# Patient Record
Sex: Male | Born: 2000 | Hispanic: Yes | Marital: Single | Smoking: Never smoker
Health system: Southern US, Community
[De-identification: ages and names within clinical notes are randomized; demographics above are authoritative.]

## PROBLEM LIST (undated history)

## (undated) DIAGNOSIS — J45909 Unspecified asthma, uncomplicated: Secondary | ICD-10-CM

## (undated) DIAGNOSIS — L309 Dermatitis, unspecified: Secondary | ICD-10-CM

## (undated) DIAGNOSIS — K37 Unspecified appendicitis: Secondary | ICD-10-CM

## (undated) HISTORY — PX: CIRCUMCISION: SUR203

## (undated) HISTORY — PX: APPENDECTOMY: SHX54

---
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
## 2020-12-10 ENCOUNTER — Other Ambulatory Visit: Payer: Self-pay

## 2020-12-10 ENCOUNTER — Encounter (HOSPITAL_COMMUNITY): Payer: Self-pay | Admitting: Oncology

## 2020-12-10 ENCOUNTER — Emergency Department (HOSPITAL_COMMUNITY)
Admission: EM | Admit: 2020-12-10 | Discharge: 2020-12-10 | Disposition: A | Discharge status: Home / Self Care | Payer: Medicaid Other | Attending: Emergency Medicine | Admitting: Emergency Medicine

## 2020-12-10 DIAGNOSIS — L509 Urticaria, unspecified: Secondary | ICD-10-CM | POA: Insufficient documentation

## 2020-12-10 DIAGNOSIS — T7840XA Allergy, unspecified, initial encounter: Secondary | ICD-10-CM

## 2020-12-10 DIAGNOSIS — J45909 Unspecified asthma, uncomplicated: Secondary | ICD-10-CM | POA: Insufficient documentation

## 2020-12-10 DIAGNOSIS — R739 Hyperglycemia, unspecified: Secondary | ICD-10-CM

## 2020-12-10 DIAGNOSIS — R21 Rash and other nonspecific skin eruption: Secondary | ICD-10-CM | POA: Diagnosis present

## 2020-12-10 LAB — CBC
HCT: 49.4 % (ref 39.0–52.0)
Hemoglobin: 17.1 g/dL — ABNORMAL HIGH (ref 13.0–17.0)
MCH: 31.3 pg (ref 26.0–34.0)
MCHC: 34.6 g/dL (ref 30.0–36.0)
MCV: 90.3 fL (ref 80.0–100.0)
Platelets: 385 10*3/uL (ref 150–400)
RBC: 5.47 MIL/uL (ref 4.22–5.81)
RDW: 13 % (ref 11.5–15.5)
WBC: 14.6 10*3/uL — ABNORMAL HIGH (ref 4.0–10.5)
nRBC: 0 % (ref 0.0–0.2)

## 2020-12-10 LAB — BASIC METABOLIC PANEL
Anion gap: 13 (ref 5–15)
BUN: 12 mg/dL (ref 6–20)
CO2: 20 mmol/L — ABNORMAL LOW (ref 22–32)
Calcium: 8.6 mg/dL — ABNORMAL LOW (ref 8.9–10.3)
Chloride: 103 mmol/L (ref 98–111)
Creatinine, Ser: 0.77 mg/dL (ref 0.61–1.24)
GFR, Estimated: >60 mL/min (ref >60)
Glucose, Bld: 222 mg/dL — ABNORMAL HIGH (ref 70–99)
Potassium: 3.4 mmol/L — ABNORMAL LOW (ref 3.5–5.1)
Sodium: 136 mmol/L (ref 135–145)

## 2020-12-10 MED ORDER — EPINEPHRINE 0.3 MG/0.3ML IJ SOAJ: 0.3000 mg | INTRAMUSCULAR | 0 refills | Status: AC | PRN

## 2020-12-10 MED ORDER — METHYLPREDNISOLONE 4 MG PO TBPK: ORAL_TABLET | ORAL | 0 refills | Status: AC

## 2020-12-10 MED ORDER — FAMOTIDINE IN NACL 20-0.9 MG/50ML-% IV SOLN
20.0000 mg | Freq: Once | INTRAVENOUS | Status: AC
  Administered 2020-12-10: 20 mg via INTRAVENOUS
  Filled 2020-12-10: qty 50

## 2020-12-10 MED ORDER — SODIUM CHLORIDE 0.9 % IV BOLUS
1000.0000 mL | Freq: Once | INTRAVENOUS | Status: AC
Start: 2020-12-10 — End: 2020-12-10
  Administered 2020-12-10: 1000 mL via INTRAVENOUS

## 2020-12-10 MED ORDER — ACETAMINOPHEN 325 MG PO TABS
650.0000 mg | ORAL_TABLET | Freq: Once | ORAL | Status: AC
  Administered 2020-12-10: 650 mg via ORAL
  Filled 2020-12-10: qty 2

## 2020-12-10 MED ORDER — DIPHENHYDRAMINE HCL 50 MG/ML IJ SOLN
25.0000 mg | Freq: Once | INTRAMUSCULAR | Status: AC
Start: 2020-12-10 — End: 2020-12-10
  Administered 2020-12-10: 25 mg via INTRAVENOUS
  Filled 2020-12-10: qty 1

## 2020-12-10 MED ORDER — ONDANSETRON HCL 4 MG/2ML IJ SOLN
4.0000 mg | Freq: Once | INTRAMUSCULAR | Status: AC
Start: 2020-12-10 — End: 2020-12-10
  Administered 2020-12-10: 4 mg via INTRAVENOUS
  Filled 2020-12-10: qty 2

## 2020-12-10 MED ORDER — DEXAMETHASONE SODIUM PHOSPHATE 10 MG/ML IJ SOLN
10.0000 mg | Freq: Once | INTRAMUSCULAR | Status: AC
Start: 2020-12-10 — End: 2020-12-10
  Administered 2020-12-10: 10 mg via INTRAVENOUS
  Filled 2020-12-10: qty 1

## 2020-12-10 MED ORDER — SODIUM CHLORIDE 0.9 % IV SOLN
INTRAVENOUS | Status: DC
Start: 2020-12-10 — End: 2020-12-10

## 2020-12-10 NOTE — Discharge Instructions (Addendum)
Get help right away if: °You develop symptoms of an allergic reaction. You may notice them soon after you are exposed to a substance. Symptoms may include: °Flushed skin. °Hives. °Swelling of the eyes, lips, face, mouth, tongue, or throat. °Difficulty breathing, speaking, or swallowing. °Wheezing. °Dizziness or light-headedness. °Fainting. °Pain or cramping in the abdomen. °Vomiting. °Diarrhea. °You used epinephrine. You need more medical care even if the medicine seems to be working. This is important because anaphylaxis may happen again within 72 hours (rebound anaphylaxis). You may need more doses of epinephrine. °

## 2020-12-10 NOTE — ED Provider Notes (Signed)
[facility]-EMERGENCY DEPT Provider Note   CSN: [id number] Arrival date & time: 12/10/20  0930     History Chief Complaint  Patient presents with   Allergic Reaction    Alan Sosa is a 20 y.o. male.  The history is provided by the patient. No language interpreter was used.  Allergic Reaction Presenting symptoms: itching, rash and swelling   Presenting symptoms: no difficulty breathing, no difficulty swallowing and no wheezing   Severity:  Moderate Duration:  30 minutes Prior allergic episodes:  No prior episodes Context: animal exposure   Context comment:  Stung by a swarm of bees or wasps Ineffective treatments:  None tried  No known hx of allergic reactions. He has eczema and Red dye makes it worse.     Past Medical History:  Diagnosis Date   Appendicitis    Asthma    Eczema     Patient Active Problem List   Diagnosis Date Noted   Abdominal pain    S/P appendectomy    AP (abdominal pain)    Hypoxia    Hypoxemia    Fever, unspecified    Appendicitis, acute 09/22/2015    Past Surgical History:  Procedure Laterality Date   APPENDECTOMY     CIRCUMCISION     LAPAROSCOPIC APPENDECTOMY N/A 09/22/2015   Procedure: APPENDECTOMY LAPAROSCOPIC;  Surgeon: Leonia Corona, MD;  Location: [facility] OR;  Service: Pediatrics;  Laterality: N/A;       Family History  Problem Relation Age of Onset   Heart disease Father     Social History   Tobacco Use   Smoking status: Never   Smokeless tobacco: Never  Substance Use Topics   Alcohol use: No   Drug use: No    Home Medications Prior to Admission medications   Medication Sig Start Date End Date Taking? Authorizing Provider  albuterol (PROVENTIL HFA;VENTOLIN HFA) 108 (90 BASE) MCG/ACT inhaler Inhale 2 puffs into the lungs every 4 (four) hours as needed for wheezing or shortness of breath. 01/12/15   Horton, Mayer Masker, MD  fluticasone (FLONASE) 50 MCG/ACT nasal spray Place 1 spray into  both nostrils daily.    [provider]  loratadine (CLARITIN) 10 MG tablet TAKE ONE TABLET BY MOUTH ONCE DAILY 01/25/15   [provider]    Allergies    Red dye  Review of Systems   Review of Systems  HENT:  Negative for trouble swallowing.   Respiratory:  Negative for wheezing.   Skin:  Positive for itching and rash.   Physical Exam Updated Vital Signs BP 122/63 (BP Location: Right Arm)   Pulse (!) 124   Temp 98.2 F (36.8 C) (Oral)   Resp (!) 22   Ht 5\' 8"  (1.727 m)   Wt 99.8 kg   BMI 33.45 kg/m   Physical Exam Vitals and nursing note reviewed.  Constitutional:      General: He is not in acute distress.    Appearance: He is well-developed. He is not diaphoretic.  HENT:     Head: Normocephalic and atraumatic.     Mouth/Throat:     Pharynx: No pharyngeal swelling.     Comments: No swelling of the lips, tongue, hypoglossal region or pharynx. Norma phonation Eyes:     General: No scleral icterus.    Extraocular Movements: Extraocular movements intact.     Conjunctiva/sclera: Conjunctivae normal.     Pupils: Pupils are equal, round, and reactive to light.  Neck:  Trachea: Trachea normal.     Comments: No stridor Cardiovascular:     Rate and Rhythm: Normal rate and regular rhythm.     Heart sounds: Normal heart sounds.  Pulmonary:     Effort: Pulmonary effort is normal. No respiratory distress.     Breath sounds: Normal breath sounds.  Abdominal:     Palpations: Abdomen is soft.     Tenderness: There is no abdominal tenderness.  Musculoskeletal:     Cervical back: Normal range of motion and neck supple.  Skin:    General: Skin is warm and dry.     Findings: Rash present. Rash is urticarial.     Comments: Diffuse , generalized urticaria  Neurological:     Mental Status: He is alert.  Psychiatric:        Behavior: Behavior normal.    ED Results / Procedures / Treatments   Labs (all labs ordered are listed, but only abnormal results  are displayed) Labs Reviewed  BASIC METABOLIC PANEL  CBC    EKG None  Radiology No results found.  Procedures Procedures   Medications Ordered in ED Medications  sodium chloride 0.9 % bolus 1,000 mL (has no administration in time range)    And  0.9 %  sodium chloride infusion (has no administration in time range)  diphenhydrAMINE (BENADRYL) injection 25 mg (has no administration in time range)  ondansetron (ZOFRAN) injection 4 mg (has no administration in time range)  dexamethasone (DECADRON) injection 10 mg (has no administration in time range)  famotidine (PEPCID) IVPB 20 mg premix (has no administration in time range)    ED Course  I have reviewed the triage vital signs and the nursing notes.  Pertinent labs & imaging results that were available during my care of the patient were reviewed by me and considered in my medical decision making (see chart for details).  Clinical Course as of 12/10/20 1453  Fri Dec 10, 2020  0945 20 yo male here after multiple bee stings while working outside.  Reports itching and hives on his body and face.  Denies tongue swelling or difficulty breathing.  Denies history of anaphylaxis or allergies to bee stings.  On exam he is well-appearing.  He is tachycardic.  He has no swelling of the tongue or the uvula.  His voice is clear.  He has no stridor.  His respirations are clear.  Do not suspect this is anaphylaxis at this time.  We will give him some IV Benadryl and IV steroids and monitor him in the ED for improvement. [MT]    Clinical Course User Index [MT] Trifan, Kermit Balo, MD   MDM Rules/Calculators/A&P                           Alan Sosa is a 20 y.o. male who presents to ED for allergic reaction to bee sting. No known triggers or exposures. No evidence of oral swelling or airway compromise. Lungs are clear bilaterally and vitals stable. No respiratory, GI or neurologic symptoms to suggest anaphylaxis. Given Benadryl, pepcid,  and steroids in ED. Monitored for 3 hours. On re-evaluation, symptoms have fully resolved  and patient feels comfortable with discharge home. Evaluation does not show pathology that would require ongoing emergent intervention or inpatient treatment. Rx for Epipen and steroid given. Labs reviewed. Patient has elevated blood sugars that may represent diabetes. He will need close F/U for this. Home care instructions and return precautions discussed.  All questions answered.  Final Clinical Impression(s) / ED Diagnoses Final diagnoses:  Allergic reaction, initial encounter  Urticaria  Hyperglycemia    Rx / DC Orders ED Discharge Orders     None        Arthor Captain, PA-C 12/10/20 1458    Terald Sleeper, MD 12/10/20 1742

## 2020-12-10 NOTE — ED Triage Notes (Signed)
Pt reports being stung by approximately 50 bees 30 minutes ago.  Pt has facial swelling, hives to upper body and is tachycardic in triage.
